# Patient Record
Sex: Female | Born: 1995 | Race: Black or African American | Hispanic: No | Marital: Single
Health system: Southern US, Community
[De-identification: ages and names within clinical notes are randomized; demographics above are authoritative.]

---

## 2015-04-07 ENCOUNTER — Encounter (HOSPITAL_BASED_OUTPATIENT_CLINIC_OR_DEPARTMENT_OTHER): Payer: Self-pay | Admitting: *Deleted

## 2015-04-07 ENCOUNTER — Emergency Department (HOSPITAL_BASED_OUTPATIENT_CLINIC_OR_DEPARTMENT_OTHER)
Admission: EM | Admit: 2015-04-07 | Discharge: 2015-04-07 | Disposition: A | Discharge status: Home / Self Care | Payer: Federal, State, Local not specified - PPO | Attending: Emergency Medicine | Admitting: Emergency Medicine

## 2015-04-07 ENCOUNTER — Emergency Department (HOSPITAL_BASED_OUTPATIENT_CLINIC_OR_DEPARTMENT_OTHER): Payer: Federal, State, Local not specified - PPO

## 2015-04-07 DIAGNOSIS — N390 Urinary tract infection, site not specified: Secondary | ICD-10-CM | POA: Diagnosis not present

## 2015-04-07 DIAGNOSIS — N83202 Unspecified ovarian cyst, left side: Secondary | ICD-10-CM | POA: Diagnosis not present

## 2015-04-07 DIAGNOSIS — Z3202 Encounter for pregnancy test, result negative: Secondary | ICD-10-CM | POA: Insufficient documentation

## 2015-04-07 DIAGNOSIS — R55 Syncope and collapse: Secondary | ICD-10-CM | POA: Diagnosis not present

## 2015-04-07 DIAGNOSIS — R42 Dizziness and giddiness: Secondary | ICD-10-CM | POA: Diagnosis present

## 2015-04-07 DIAGNOSIS — R1032 Left lower quadrant pain: Secondary | ICD-10-CM

## 2015-04-07 LAB — URINE MICROSCOPIC-ADD ON

## 2015-04-07 LAB — WET PREP, GENITAL
Clue Cells Wet Prep HPF POC: NONE SEEN
Sperm: NONE SEEN
Trich, Wet Prep: NONE SEEN
Yeast Wet Prep HPF POC: NONE SEEN

## 2015-04-07 LAB — URINALYSIS, ROUTINE W REFLEX MICROSCOPIC
GLUCOSE, UA: NEGATIVE mg/dL
HGB URINE DIPSTICK: NEGATIVE
KETONES UR: 15 mg/dL — AB
Nitrite: POSITIVE — AB
PROTEIN: NEGATIVE mg/dL
Specific Gravity, Urine: 1.012 (ref 1.005–1.030)
pH: 5 (ref 5.0–8.0)

## 2015-04-07 LAB — BASIC METABOLIC PANEL
Anion gap: 7 (ref 5–15)
BUN: 10 mg/dL (ref 6–20)
CO2: 22 mmol/L (ref 22–32)
Calcium: 8.9 mg/dL (ref 8.9–10.3)
Chloride: 108 mmol/L (ref 101–111)
Creatinine, Ser: 0.89 mg/dL (ref 0.44–1.00)
GFR calc Af Amer: >60 mL/min (ref >60)
GFR calc non Af Amer: >60 mL/min (ref >60)
Glucose, Bld: 92 mg/dL (ref 65–99)
Potassium: 3.7 mmol/L (ref 3.5–5.1)
Sodium: 137 mmol/L (ref 135–145)

## 2015-04-07 LAB — CBC
HCT: 33.4 % — ABNORMAL LOW (ref 36.0–46.0)
HEMOGLOBIN: 10.9 g/dL — AB (ref 12.0–15.0)
MCH: 26.5 pg (ref 26.0–34.0)
MCHC: 32.6 g/dL (ref 30.0–36.0)
MCV: 81.3 fL (ref 78.0–100.0)
PLATELETS: 286 10*3/uL (ref 150–400)
RBC: 4.11 MIL/uL (ref 3.87–5.11)
RDW: 15.6 % — AB (ref 11.5–15.5)
WBC: 5.4 10*3/uL (ref 4.0–10.5)

## 2015-04-07 LAB — PREGNANCY, URINE: PREG TEST UR: NEGATIVE

## 2015-04-07 MED ORDER — CEPHALEXIN 500 MG PO CAPS: 500.0000 mg | ORAL_CAPSULE | Freq: Four times a day (QID) | ORAL | Status: AC

## 2015-04-07 MED ORDER — DEXTROSE 5 % IV SOLN
1.0000 g | Freq: Once | INTRAVENOUS | Status: AC
  Administered 2015-04-07: 1 g via INTRAVENOUS

## 2015-04-07 MED ORDER — CEFTRIAXONE SODIUM 1 G IJ SOLR
INTRAMUSCULAR | Status: AC
  Filled 2015-04-07: qty 10

## 2015-04-07 NOTE — ED Provider Notes (Signed)
CSN: [id number]     Arrival date & time 04/07/15  1226 History   First MD Initiated Contact with Patient 04/07/15 1302     Chief Complaint  Patient presents with  . hot/sweaty/lightheaded earlier      (Consider location/radiation/quality/duration/timing/severity/associated sxs/prior Treatment) HPI Comments: Patient presents to the emergency department with chief complaint of syncopal episode. She states that she began to feel lightheaded while she was in class today. She states that she stepped out of class, felt lightheaded, flushed, and thinks that she passed out. She states that someone said that she was unconscious for a few minutes. The first thing she remembers is waking up to people surrounding her. She states that she has not had any chest pain or shortness of breath. She does report some left-sided abdominal pain. She also states that she has had intermittent dysuria. She reports a history of frequent UTIs. Additionally, she reports having some brown vaginal discharge, which is new. She has tried taking AZO for her dysuria in the past week. She denies any other medical problems. There are no aggravating or alleviating factors. She states that she has had a syncopal episode in the past in high school, which was thought to be because of dehydration.  The history is provided by the patient. No language interpreter was used.    History reviewed. No pertinent past medical history. History reviewed. No pertinent past surgical history. No family history on file. Social History  Substance Use Topics  . Smoking status: None  . Smokeless tobacco: None  . Alcohol Use: None   OB History    No data available     Review of Systems  Constitutional: Negative for fever and chills.  Respiratory: Negative for shortness of breath.   Cardiovascular: Negative for chest pain.  Gastrointestinal: Positive for abdominal pain. Negative for nausea, vomiting, diarrhea and constipation.  Genitourinary:  Negative for dysuria.  Neurological: Positive for syncope.  All other systems reviewed and are negative.     Allergies  Review of patient's allergies indicates no known allergies.  Home Medications   Prior to Admission medications   Not on File   LMP 02/25/2015 Physical Exam  Constitutional: She is oriented to person, place, and time. She appears well-developed and well-nourished.  Well-appearing  HENT:  Head: Normocephalic and atraumatic.  Eyes: Conjunctivae and EOM are normal. Pupils are equal, round, and reactive to light.  Neck: Normal range of motion. Neck supple.  Cardiovascular: Normal rate and regular rhythm.  Exam reveals no gallop and no friction rub.   No murmur heard. Pulmonary/Chest: Effort normal and breath sounds normal. No respiratory distress. She has no wheezes. She has no rales. She exhibits no tenderness.  Abdominal: Soft. Bowel sounds are normal. She exhibits no distension and no mass. There is tenderness. There is no rebound and no guarding.  Left lower quadrant abdominal tenderness, no other focal abdominal tenderness, no rebound, no guarding  Musculoskeletal: Normal range of motion. She exhibits no edema or tenderness.  Neurological: She is alert and oriented to person, place, and time.  Skin: Skin is warm and dry.  Psychiatric: She has a normal mood and affect. Her behavior is normal. Judgment and thought content normal.  Nursing note and vitals reviewed.   ED Course  Procedures (including critical care time) Results for orders placed or performed during the hospital encounter of 04/07/15  Wet prep, genital  Result Value Ref Range   Yeast Wet Prep HPF POC NONE SEEN NONE SEEN  Trich, Wet Prep NONE SEEN NONE SEEN   Clue Cells Wet Prep HPF POC NONE SEEN NONE SEEN   WBC, Wet Prep HPF POC MANY (A) NONE SEEN   Sperm NONE SEEN   Urinalysis, Routine w reflex microscopic (not at [facility])  Result Value Ref Range   Color, Urine RED (A) YELLOW   APPearance  CLOUDY (A) CLEAR   Specific Gravity, Urine 1.012 1.005 - 1.030   pH 5.0 5.0 - 8.0   Glucose, UA NEGATIVE NEGATIVE mg/dL   Hgb urine dipstick NEGATIVE NEGATIVE   Bilirubin Urine SMALL (A) NEGATIVE   Ketones, ur 15 (A) NEGATIVE mg/dL   Protein, ur NEGATIVE NEGATIVE mg/dL   Nitrite POSITIVE (A) NEGATIVE   Leukocytes, UA MODERATE (A) NEGATIVE  Pregnancy, urine  Result Value Ref Range   Preg Test, Ur NEGATIVE NEGATIVE  CBC  Result Value Ref Range   WBC 5.4 4.0 - 10.5 K/uL   RBC 4.11 3.87 - 5.11 MIL/uL   Hemoglobin 10.9 (L) 12.0 - 15.0 g/dL   HCT 09.8 (L) 11.9 - 14.7 %   MCV 81.3 78.0 - 100.0 fL   MCH 26.5 26.0 - 34.0 pg   MCHC 32.6 30.0 - 36.0 g/dL   RDW 82.9 (H) 56.2 - 13.0 %   Platelets 286 150 - 400 K/uL  Basic metabolic panel  Result Value Ref Range   Sodium 137 135 - 145 mmol/L   Potassium 3.7 3.5 - 5.1 mmol/L   Chloride 108 101 - 111 mmol/L   CO2 22 22 - 32 mmol/L   Glucose, Bld 92 65 - 99 mg/dL   BUN 10 6 - 20 mg/dL   Creatinine, Ser 8.65 0.44 - 1.00 mg/dL   Calcium 8.9 8.9 - 78.4 mg/dL   GFR calc non Af Amer >60 >60 mL/min   GFR calc Af Amer >60 >60 mL/min   Anion gap 7 5 - 15  Urine microscopic-add on  Result Value Ref Range   Squamous Epithelial / LPF 0-5 (A) NONE SEEN   WBC, UA 6-30 0 - 5 WBC/hpf   RBC / HPF 0-5 0 - 5 RBC/hpf   Bacteria, UA MANY (A) NONE SEEN   Urine-Other MUCOUS PRESENT    US Transvaginal Non-ob  04/07/2015  CLINICAL DATA:  Left lower quadrant pain for 1 week EXAM: TRANSABDOMINAL AND TRANSVAGINAL ULTRASOUND OF PELVIS DOPPLER ULTRASOUND OF OVARIES TECHNIQUE: Both transabdominal and transvaginal ultrasound examinations of the pelvis were performed. Transabdominal technique was performed for global imaging of the pelvis including uterus, ovaries, adnexal regions, and pelvic cul-de-sac. It was necessary to proceed with endovaginal exam following the transabdominal exam to visualize the uterus and ovaries. Color and duplex Doppler ultrasound was  utilized to evaluate blood flow to the ovaries. COMPARISON:  None. FINDINGS: Uterus Measurements: 6.5 x 3.2 x 4.3 cm. No fibroids or other mass visualized. Endometrium Thickness: 5 mm.  No focal abnormality visualized. Right ovary Measurements: 3.4 x 1.3 x 2.4 cm. Normal appearance/no adnexal mass. Left ovary Measurements: 2.8 x 2.4 x 2.3 cm. There is a 1 cm cyst arising in an exophytic fashion from the left ovary. Pulsed Doppler evaluation of both ovaries demonstrates normal low-resistance arterial and venous waveforms. Other findings No abnormal free fluid. IMPRESSION: 1 cm left ovarian cyst. No other focal abnormality is noted. Electronically Signed   By: Alcide Clever M.D.   On: 04/07/2015 14:50   US Pelvis Complete  04/07/2015  CLINICAL DATA:  Left lower quadrant pain for 1 week EXAM:  TRANSABDOMINAL AND TRANSVAGINAL ULTRASOUND OF PELVIS DOPPLER ULTRASOUND OF OVARIES TECHNIQUE: Both transabdominal and transvaginal ultrasound examinations of the pelvis were performed. Transabdominal technique was performed for global imaging of the pelvis including uterus, ovaries, adnexal regions, and pelvic cul-de-sac. It was necessary to proceed with endovaginal exam following the transabdominal exam to visualize the uterus and ovaries. Color and duplex Doppler ultrasound was utilized to evaluate blood flow to the ovaries. COMPARISON:  None. FINDINGS: Uterus Measurements: 6.5 x 3.2 x 4.3 cm. No fibroids or other mass visualized. Endometrium Thickness: 5 mm.  No focal abnormality visualized. Right ovary Measurements: 3.4 x 1.3 x 2.4 cm. Normal appearance/no adnexal mass. Left ovary Measurements: 2.8 x 2.4 x 2.3 cm. There is a 1 cm cyst arising in an exophytic fashion from the left ovary. Pulsed Doppler evaluation of both ovaries demonstrates normal low-resistance arterial and venous waveforms. Other findings No abnormal free fluid. IMPRESSION: 1 cm left ovarian cyst. No other focal abnormality is noted. Electronically Signed    By: Alcide CleverMark  Lukens M.D.   On: 04/07/2015 14:50   [place] Art/ven Flow Abd Pelv Doppler  04/07/2015  CLINICAL DATA:  Left lower quadrant pain for 1 week EXAM: TRANSABDOMINAL AND TRANSVAGINAL ULTRASOUND OF PELVIS DOPPLER ULTRASOUND OF OVARIES TECHNIQUE: Both transabdominal and transvaginal ultrasound examinations of the pelvis were performed. Transabdominal technique was performed for global imaging of the pelvis including uterus, ovaries, adnexal regions, and pelvic cul-de-sac. It was necessary to proceed with endovaginal exam following the transabdominal exam to visualize the uterus and ovaries. Color and duplex Doppler ultrasound was utilized to evaluate blood flow to the ovaries. COMPARISON:  None. FINDINGS: Uterus Measurements: 6.5 x 3.2 x 4.3 cm. No fibroids or other mass visualized. Endometrium Thickness: 5 mm.  No focal abnormality visualized. Right ovary Measurements: 3.4 x 1.3 x 2.4 cm. Normal appearance/no adnexal mass. Left ovary Measurements: 2.8 x 2.4 x 2.3 cm. There is a 1 cm cyst arising in an exophytic fashion from the left ovary. Pulsed Doppler evaluation of both ovaries demonstrates normal low-resistance arterial and venous waveforms. Other findings No abnormal free fluid. IMPRESSION: 1 cm left ovarian cyst. No other focal abnormality is noted. Electronically Signed   By: Alcide CleverMark  Lukens M.D.   On: 04/07/2015 14:50    I have personally reviewed and evaluated these images and lab results as part of my medical decision-making.   EKG Interpretation None      MDM   Final diagnoses:  UTI (lower urinary tract infection)  Cyst of left ovary    Patient with syncopal episode today. Will check labs, EKG, urine pregnancy. Patient also reports dysuria, will check urinalysis. She has had some brown vaginal discharge, and does have some left lower quadrant tenderness. She will need to have a pelvic exam, and likely pelvic ultrasound.  Vital signs are stable. H&H slightly low, but doubt symptomatic  anemia. Recommend recheck by primary care provider.  Urinalysis consistent with UTI. Pelvic ultrasound remarkable for left ovarian cyst. Will treat with Rocephin in the ED, will give Keflex for home. Recommend ibuprofen for pain. Follow-up with primary care provider. Patient understands and agrees the plan. She is stable and ready for discharge.    Roxy Horsemanobert Jazmeen Axtell, PA-C 04/07/15 1621  Vanetta MuldersScott Zackowski, MD 04/08/15 [phone]

## 2015-04-07 NOTE — ED Notes (Addendum)
Pt to triage by ems, able to stand and walk to triage from stretcher with quick steady gait, texting and smiling in nad. 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 She stepped outside her classroom and sat down on floor, her teachers called ems. Pt states she feels fine now. Also reports some brownish discharge this am, on the tissue when she wiped, did not need to use pad.

## 2015-04-07 NOTE — ED Notes (Signed)
Patient in Ultrasound at this time

## 2015-04-07 NOTE — Discharge Instructions (Signed)
Ovarian Cyst °An ovarian cyst is a fluid-filled sac that forms on an ovary. The ovaries are small organs that produce eggs in women. Various types of cysts can form on the ovaries. Most are not cancerous. Many do not cause problems, and they often go away on their own. Some may cause symptoms and require treatment. Common types of ovarian cysts include: °· Functional cysts--These cysts may occur every month during the menstrual cycle. This is normal. The cysts usually go away with the next menstrual cycle if the woman does not get pregnant. Usually, there are no symptoms with a functional cyst. °· Endometrioma cysts--These cysts form from the tissue that lines the uterus. They are also called "chocolate cysts" because they become filled with blood that turns brown. This type of cyst can cause pain in the lower abdomen during intercourse and with your menstrual period. °· Cystadenoma cysts--This type develops from the cells on the outside of the ovary. These cysts can get very big and cause lower abdomen pain and pain with intercourse. This type of cyst can twist on itself, cut off its blood supply, and cause severe pain. It can also easily rupture and cause a lot of pain. °· Dermoid cysts--This type of cyst is sometimes found in both ovaries. These cysts may contain different kinds of body tissue, such as skin, teeth, hair, or cartilage. They usually do not cause symptoms unless they get very big. °· Theca lutein cysts--These cysts occur when too much of a certain hormone (human chorionic gonadotropin) is produced and overstimulates the ovaries to produce an egg. This is most common after procedures used to assist with the conception of a baby (in vitro fertilization). °CAUSES  °· Fertility drugs can cause a condition in which multiple large cysts are formed on the ovaries. This is called ovarian hyperstimulation syndrome. °· A condition called polycystic ovary syndrome can cause hormonal imbalances that can lead to  nonfunctional ovarian cysts. °SIGNS AND SYMPTOMS  °Many ovarian cysts do not cause symptoms. If symptoms are present, they may include: °· Pelvic pain or pressure. °· Pain in the lower abdomen. °· Pain during sexual intercourse. °· Increasing girth (swelling) of the abdomen. °· Abnormal menstrual periods. °· Increasing pain with menstrual periods. °· Stopping having menstrual periods without being pregnant. °DIAGNOSIS  °These cysts are commonly found during a routine or annual pelvic exam. Tests may be ordered to find out more about the cyst. These tests may include: °· Ultrasound. °· X-ray of the pelvis. °· CT scan. °· MRI. °· Blood tests. °TREATMENT  °Many ovarian cysts go away on their own without treatment. Your health care provider may want to check your cyst regularly for 2-3 months to see if it changes. For women in menopause, it is particularly important to monitor a cyst closely because of the higher rate of ovarian cancer in menopausal women. When treatment is needed, it may include any of the following: °· A procedure to drain the cyst (aspiration). This may be done using a long needle and ultrasound. It can also be done through a laparoscopic procedure. This involves using a thin, lighted tube with a tiny camera on the end (laparoscope) inserted through a small incision. °· Surgery to remove the whole cyst. This may be done using laparoscopic surgery or an open surgery involving a larger incision in the lower abdomen. °· Hormone treatment or birth control pills. These methods are sometimes used to help dissolve a cyst. °HOME CARE INSTRUCTIONS  °· Only take over-the-counter   or prescription medicines as directed by your health care provider. °· Follow up with your health care provider as directed. °· Get regular pelvic exams and Pap tests. °SEEK MEDICAL CARE IF:  °· Your periods are late, irregular, or painful, or they stop. °· Your pelvic pain or abdominal pain does not go away. °· Your abdomen becomes  larger or swollen. °· You have pressure on your bladder or trouble emptying your bladder completely. °· You have pain during sexual intercourse. °· You have feelings of fullness, pressure, or discomfort in your stomach. °· You lose weight for no apparent reason. °· You feel generally ill. °· You become constipated. °· You lose your appetite. °· You develop acne. °· You have an increase in body and facial hair. °· You are gaining weight, without changing your exercise and eating habits. °· You think you are pregnant. °SEEK IMMEDIATE MEDICAL CARE IF:  °· You have increasing abdominal pain. °· You feel sick to your stomach (nauseous), and you throw up (vomit). °· You develop a fever that comes on suddenly. °· You have abdominal pain during a bowel movement. °· Your menstrual periods become heavier than usual. °MAKE SURE YOU: °· Understand these instructions. °· Will watch your condition. °· Will get help right away if you are not doing well or get worse. °  °This information is not intended to replace advice given to you by your health care provider. Make sure you discuss any questions you have with your health care provider. °  °Document Released: 03/13/2005 Document Revised: 03/18/2013 Document Reviewed: 11/18/2012 °Elsevier Interactive Patient Education ©2016 [employer] °Urinary Tract Infection °Urinary tract infections (UTIs) can develop anywhere along your urinary tract. Your urinary tract is your body's drainage system for removing wastes and extra water. Your urinary tract includes two kidneys, two ureters, a bladder, and a urethra. Your kidneys are a pair of bean-shaped organs. Each kidney is about the size of your fist. They are located below your ribs, one on each side of your spine. °CAUSES °Infections are caused by microbes, which are microscopic organisms, including fungi, viruses, and bacteria. These organisms are so small that they can only be seen through a microscope. Bacteria are the microbes that  most commonly cause UTIs. °SYMPTOMS  °Symptoms of UTIs may vary by age and gender of the patient and by the location of the infection. Symptoms in young women typically include a frequent and intense urge to urinate and a painful, burning feeling in the bladder or urethra during urination. Older women and men are more likely to be tired, shaky, and weak and have muscle aches and abdominal pain. A fever may mean the infection is in your kidneys. Other symptoms of a kidney infection include pain in your back or sides below the ribs, nausea, and vomiting. °DIAGNOSIS °To diagnose a UTI, your caregiver will ask you about your symptoms. Your caregiver will also ask you to provide a urine sample. The urine sample will be tested for bacteria and white blood cells. White blood cells are made by your body to help fight infection. °TREATMENT  °Typically, UTIs can be treated with medication. Because most UTIs are caused by a bacterial infection, they usually can be treated with the use of antibiotics. The choice of antibiotic and length of treatment depend on your symptoms and the type of bacteria causing your infection. °HOME CARE INSTRUCTIONS °· If you were prescribed antibiotics, take them exactly as your caregiver instructs you. Finish the medication even if you   feel better after you have only taken some of the medication. °· Drink enough water and fluids to keep your urine clear or pale yellow. °· Avoid caffeine, tea, and carbonated beverages. They tend to irritate your bladder. °· Empty your bladder often. Avoid holding urine for long periods of time. °· Empty your bladder before and after sexual intercourse. °· After a bowel movement, women should cleanse from front to back. Use each tissue only once. °SEEK MEDICAL CARE IF:  °· You have back pain. °· You develop a fever. °· Your symptoms do not begin to resolve within 3 days. °SEEK IMMEDIATE MEDICAL CARE IF:  °· You have severe back pain or lower abdominal pain. °· You  develop chills. °· You have nausea or vomiting. °· You have continued burning or discomfort with urination. °MAKE SURE YOU:  °· Understand these instructions. °· Will watch your condition. °· Will get help right away if you are not doing well or get worse. °  °This information is not intended to replace advice given to you by your health care provider. Make sure you discuss any questions you have with your health care provider. °  °Document Released: 12/21/2004 Document Revised: 12/02/2014 Document Reviewed: 04/21/2011 °Elsevier Interactive Patient Education ©2016 [employer] ° °

## 2015-04-08 LAB — GC/CHLAMYDIA PROBE AMP (~~LOC~~) NOT AT ARMC
Chlamydia: NEGATIVE
Neisseria Gonorrhea: NEGATIVE

## 2017-02-11 IMAGING — US US TRANSVAGINAL NON-OB
1 series · 13 of 25 positions shown · non-contrast
Comparison: None.

CLINICAL DATA: Left lower quadrant pain for 1 week

EXAM:
TRANSABDOMINAL AND TRANSVAGINAL ULTRASOUND OF PELVIS
DOPPLER ULTRASOUND OF OVARIES
TECHNIQUE: Both transabdominal and transvaginal ultrasound examinations of the
pelvis were performed. Transabdominal technique was performed for
global imaging of the pelvis including uterus, ovaries, adnexal
regions, and pelvic cul-de-sac.
It was necessary to proceed with endovaginal exam following the
transabdominal exam to visualize the uterus and ovaries. Color and
duplex Doppler ultrasound was utilized to evaluate blood flow to the
ovaries.

[Series 1: us transvaginal non-ob · 0.24mm/px · 13 of 63 slices shown]
[im 1/63]
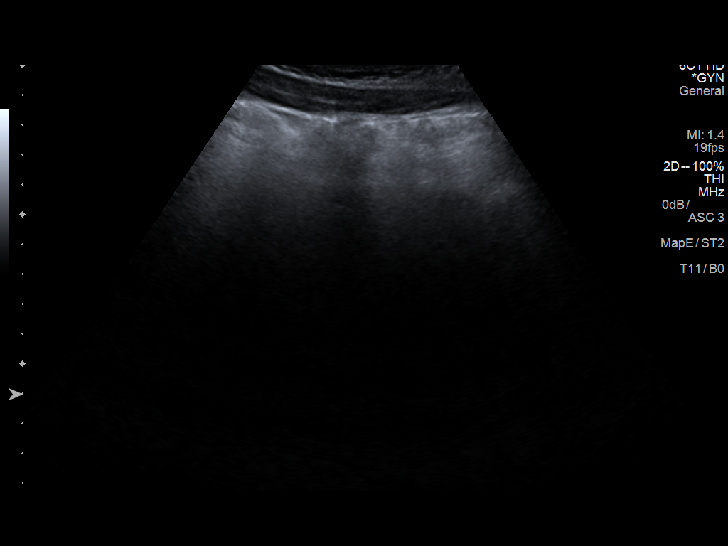
[im 6/63]
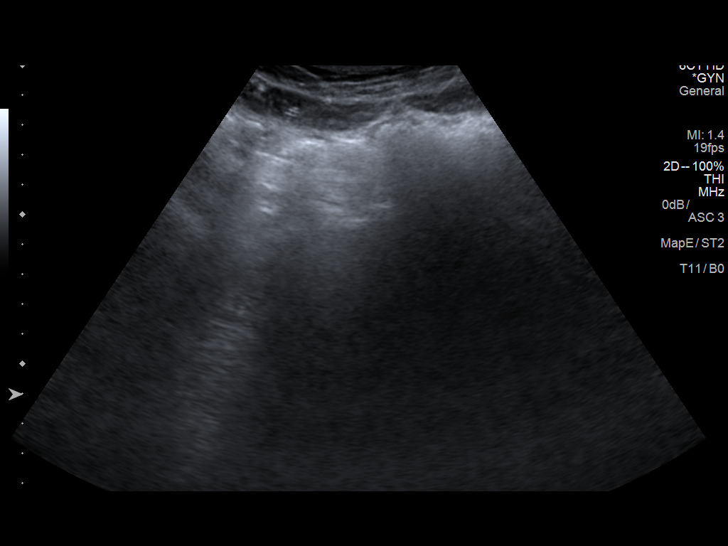
[im 11/63]
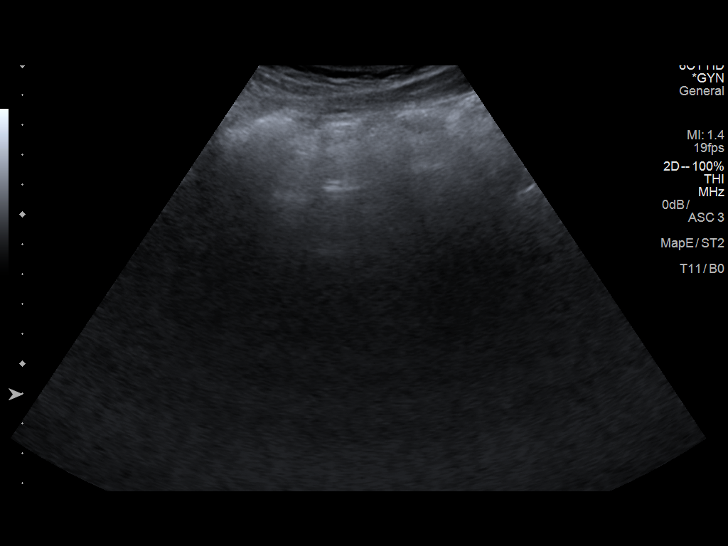
[im 16/63]
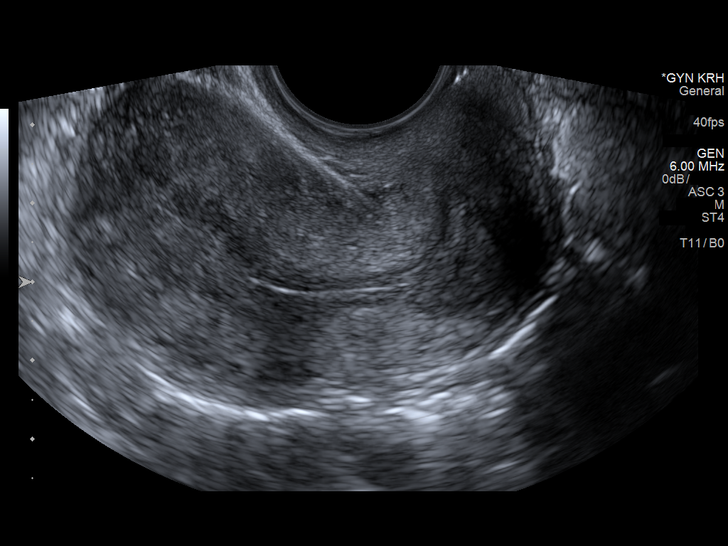
[im 21/63]
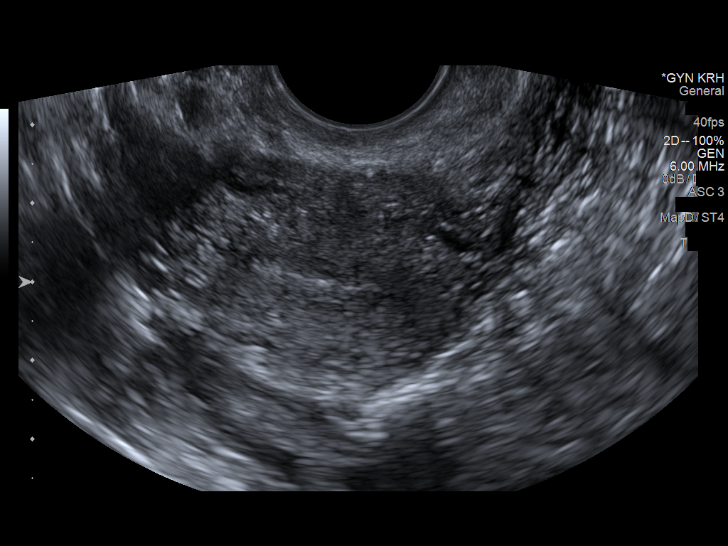
[im 26/63]
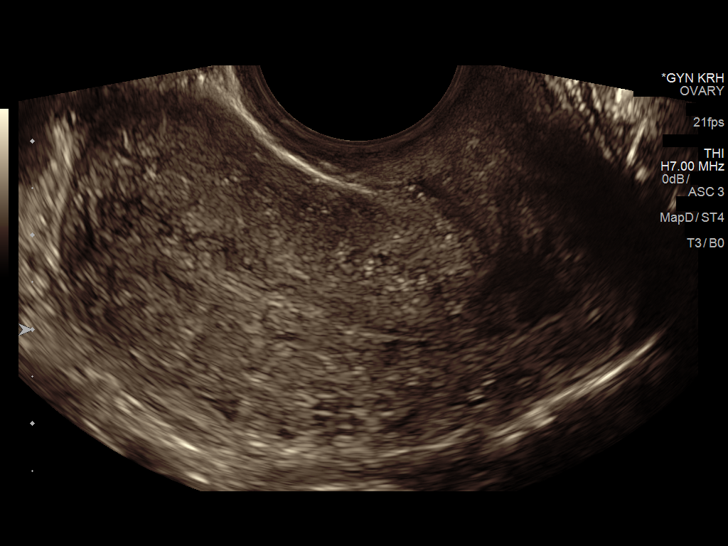
[im 32/63]
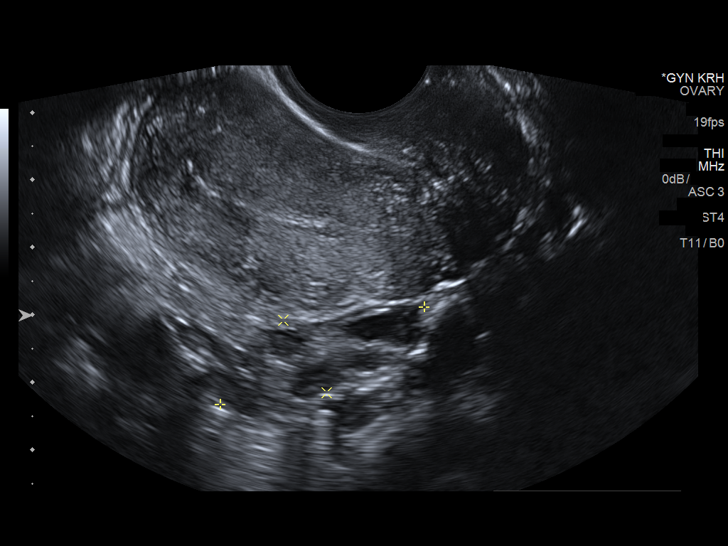
[im 37/63]
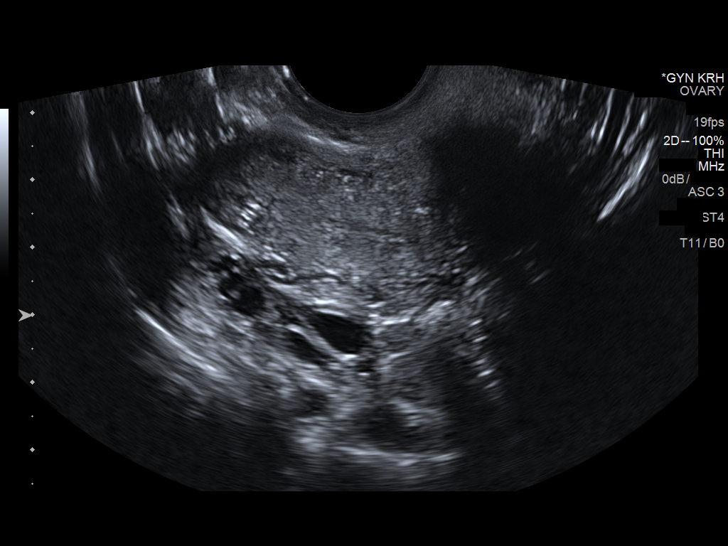
[im 42/63]
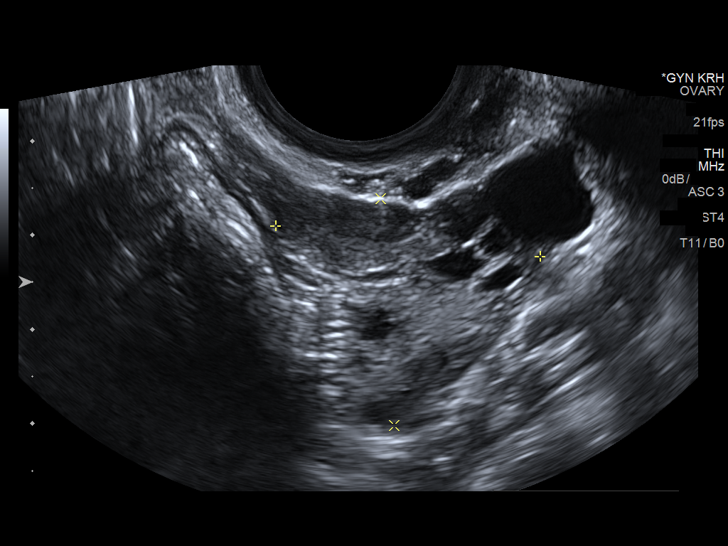
[im 47/63]
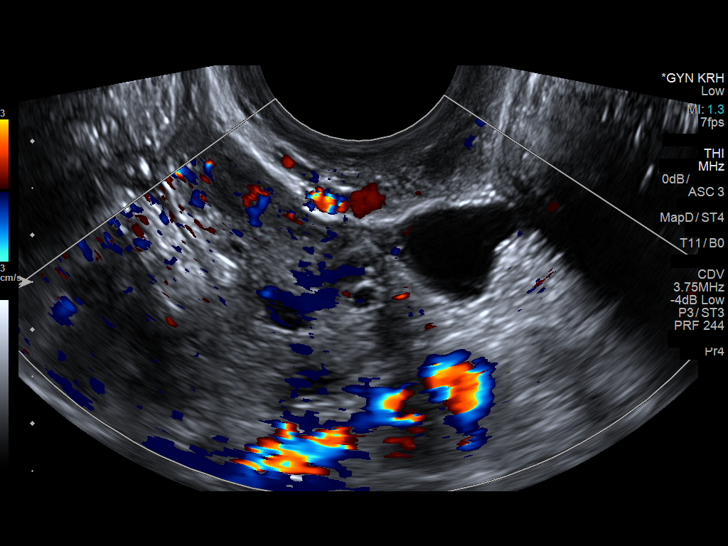
[im 52/63]
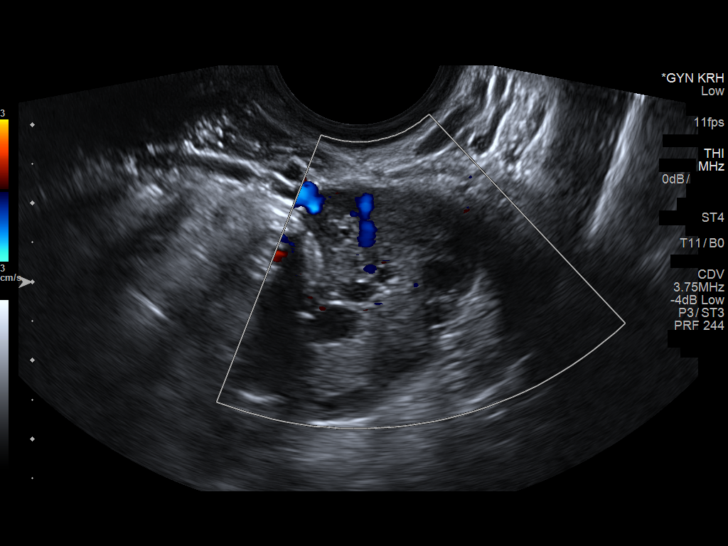
[im 57/63]
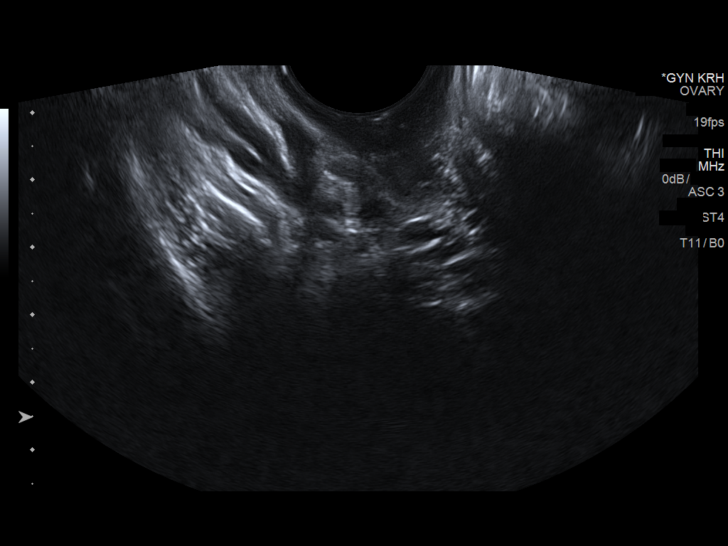
[im 63/63]
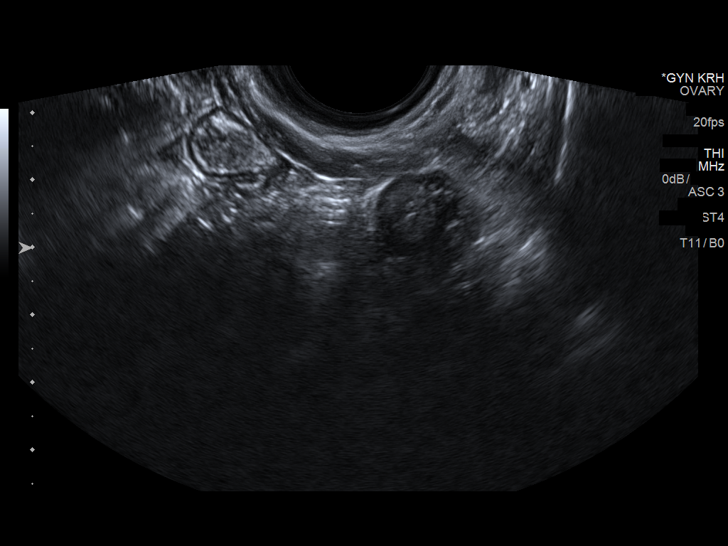

[13 of 25 positions shown; findings below may reference images not displayed]

FINDINGS: Uterus

Measurements: 6.5 x 3.2 x 4.3 cm.. No fibroids or other mass
visualized.

Endometrium

Thickness: 5 mm..  No focal abnormality visualized.

Right ovary

Measurements: 3.4 x 1.3 x 2.4 cm.. Normal appearance/no adnexal
mass.

Left ovary

Measurements: 2.8 x 2.4 x 2.3 cm. There is a 1 cm cyst arising in an
exophytic fashion from the left ovary..

Pulsed Doppler evaluation of both ovaries demonstrates normal
low-resistance arterial and venous waveforms.

Other findings

No abnormal free fluid.
IMPRESSION: 1 cm left ovarian cyst.

No other focal abnormality is noted.
